# Patient Record
Sex: Female | Born: 1990 | Race: White | Hispanic: No | Marital: Single | Smoking: Never smoker
Health system: Southern US, Community
[De-identification: ages and names within clinical notes are randomized; demographics above are authoritative.]

## PROBLEM LIST (undated history)

## (undated) DIAGNOSIS — K589 Irritable bowel syndrome without diarrhea: Secondary | ICD-10-CM

---

## 2010-10-10 ENCOUNTER — Emergency Department (INDEPENDENT_AMBULATORY_CARE_PROVIDER_SITE_OTHER): Payer: Self-pay

## 2010-10-10 ENCOUNTER — Encounter: Payer: Self-pay | Admitting: *Deleted

## 2010-10-10 ENCOUNTER — Emergency Department (HOSPITAL_BASED_OUTPATIENT_CLINIC_OR_DEPARTMENT_OTHER)
Admission: EM | Admit: 2010-10-10 | Discharge: 2010-10-10 | Disposition: A | Discharge status: Home / Self Care | Payer: Self-pay | Attending: Emergency Medicine | Admitting: Emergency Medicine

## 2010-10-10 ENCOUNTER — Emergency Department (HOSPITAL_BASED_OUTPATIENT_CLINIC_OR_DEPARTMENT_OTHER): Payer: Self-pay

## 2010-10-10 DIAGNOSIS — R109 Unspecified abdominal pain: Secondary | ICD-10-CM

## 2010-10-10 DIAGNOSIS — R112 Nausea with vomiting, unspecified: Secondary | ICD-10-CM | POA: Insufficient documentation

## 2010-10-10 DIAGNOSIS — R1084 Generalized abdominal pain: Secondary | ICD-10-CM | POA: Insufficient documentation

## 2010-10-10 DIAGNOSIS — R197 Diarrhea, unspecified: Secondary | ICD-10-CM

## 2010-10-10 DIAGNOSIS — R110 Nausea: Secondary | ICD-10-CM

## 2010-10-10 HISTORY — DX: Irritable bowel syndrome, unspecified: K58.9

## 2010-10-10 LAB — COMPREHENSIVE METABOLIC PANEL
AST: 80 U/L — ABNORMAL HIGH (ref 0–37)
BUN: 11 mg/dL (ref 6–23)
CO2: 26 mEq/L (ref 19–32)
Calcium: 9.2 mg/dL (ref 8.4–10.5)
Creatinine, Ser: 0.6 mg/dL (ref 0.50–1.10)
GFR calc Af Amer: >60 mL/min (ref >60)
GFR calc non Af Amer: >60 mL/min (ref >60)
Total Bilirubin: 0.5 mg/dL (ref 0.3–1.2)

## 2010-10-10 LAB — CBC
HCT: 38.3 % (ref 36.0–46.0)
MCH: 28.9 pg (ref 26.0–34.0)
MCV: 83.3 fL (ref 78.0–100.0)
Platelets: 188 10*3/uL (ref 150–400)
RBC: 4.6 MIL/uL (ref 3.87–5.11)

## 2010-10-10 LAB — URINALYSIS, ROUTINE W REFLEX MICROSCOPIC
Bilirubin Urine: NEGATIVE
Glucose, UA: NEGATIVE mg/dL
Hgb urine dipstick: NEGATIVE
Protein, ur: NEGATIVE mg/dL
Specific Gravity, Urine: 1.016 (ref 1.005–1.030)
Urobilinogen, UA: 0.2 mg/dL (ref 0.0–1.0)

## 2010-10-10 LAB — LIPASE, BLOOD: Lipase: 15 U/L (ref 11–59)

## 2010-10-10 MED ORDER — DICYCLOMINE HCL 20 MG PO TABS: 20.0000 mg | ORAL_TABLET | Freq: Two times a day (BID) | ORAL | Status: AC

## 2010-10-10 NOTE — ED Provider Notes (Signed)
History     Chief Complaint  Patient presents with  . Abdominal Cramping   Patient is a 20 y.o. female presenting with cramps. The history is provided by the patient.  Abdominal Cramping The primary symptoms of the illness include abdominal pain, nausea, vomiting and diarrhea. The current episode started more than 2 days ago. The onset of the illness was gradual. The problem has been gradually worsening.  The patient states that she believes she is currently not pregnant. Additional symptoms associated with the illness include back pain.  Pt is currently on periood,  Pt reports she has had IBS.  She is not currently on any medications.  Past Medical History  Diagnosis Date  . IBS (irritable bowel syndrome)     History reviewed. No pertinent past surgical history.  Family History  Problem Relation Age of Onset  . Diabetes Mother     History  Substance Use Topics  . Smoking status: Never Smoker   . Smokeless tobacco: Not on file  . Alcohol Use: No    OB History    Grav Para Term Preterm Abortions TAB SAB Ect Mult Living                  Review of Systems  Gastrointestinal: Positive for nausea, vomiting, abdominal pain and diarrhea.  Musculoskeletal: Positive for back pain.  All other systems reviewed and are negative.    Physical Exam  BP 122/78  Pulse 66  Temp(Src) 98 F (36.7 C) (Oral)  Resp 16  Wt 204 lb (92.534 kg)  SpO2 100%  LMP 10/09/2010  Physical Exam  Constitutional: She is oriented to person, place, and time. She appears well-developed.  HENT:  Head: Normocephalic and atraumatic.  Eyes: Conjunctivae are normal. Pupils are equal, round, and reactive to light.  Neck: Normal range of motion.  Cardiovascular: Normal rate.   Pulmonary/Chest: Effort normal.  Abdominal: She exhibits no distension. There is no tenderness. There is no guarding.  Musculoskeletal: Normal range of motion.  Neurological: She is alert and oriented to person, place, and  time.  Skin: Skin is warm.  Psychiatric: She has a normal mood and affect.    ED Course  Procedures  MDM Ultrasound no gallbladder disease.  Pt has had IBS in the past.  I will treat with bentyl and refer to primary care for on going evaluation.        Langston Masker, [place] 10/10/10 1843

## 2010-10-10 NOTE — ED Notes (Signed)
Pt c/o abd cramping x 1 week with nausea Hx IBS and constipaion

## 2010-10-10 NOTE — ED Notes (Signed)
MD at bedside. 

## 2010-10-14 NOTE — ED Provider Notes (Signed)
Medical screening examination/treatment/procedure(s) were performed by non-physician practitioner and as supervising physician I was immediately available for consultation/collaboration.   Charles B. Bernette Mayers, MD 10/14/10 [phone]

## 2012-02-09 IMAGING — US US ABDOMEN COMPLETE
1 series · 13 of 25 positions shown · non-contrast
Comparison: None.

CLINICAL DATA: Abdominal pain and cramping.  Nausea and diarrhea
for 1 week.

COMPLETE ABDOMINAL ULTRASOUND

[Series 1: us abdomen complete · 0.30mm/px · 13 of 74 slices shown]
[im 1/74]
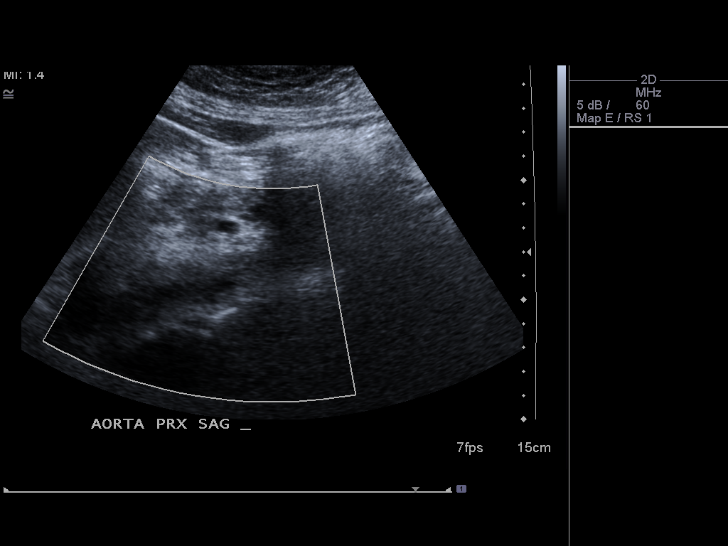
[im 7/74]
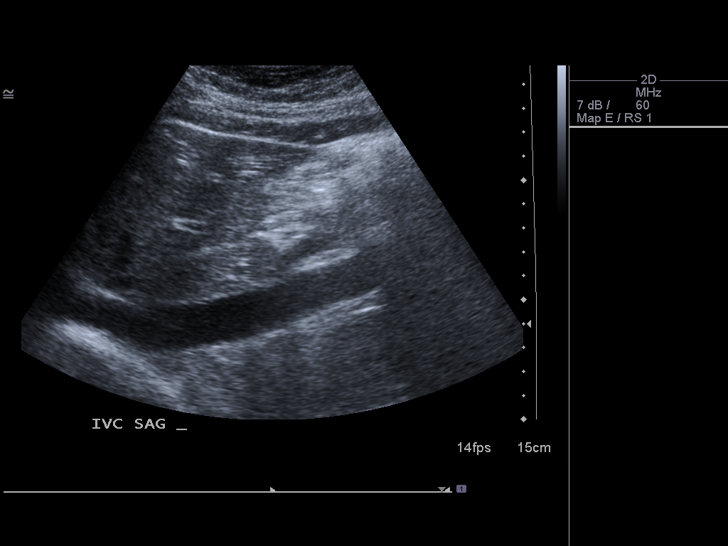
[im 13/74]
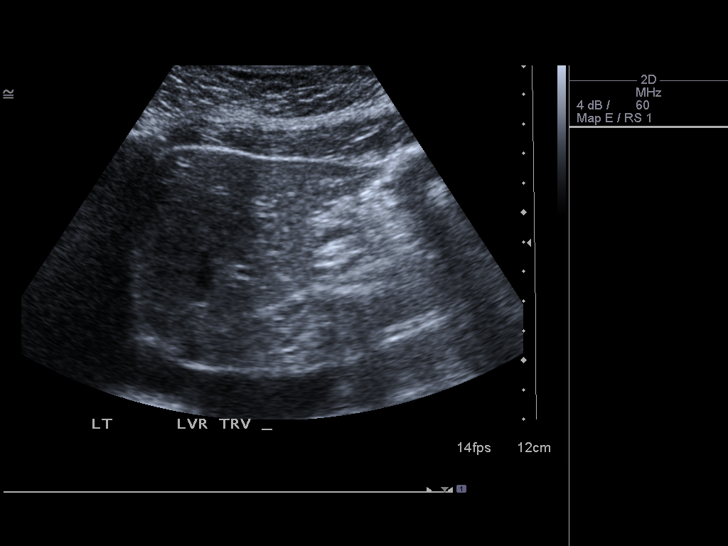
[im 19/74]
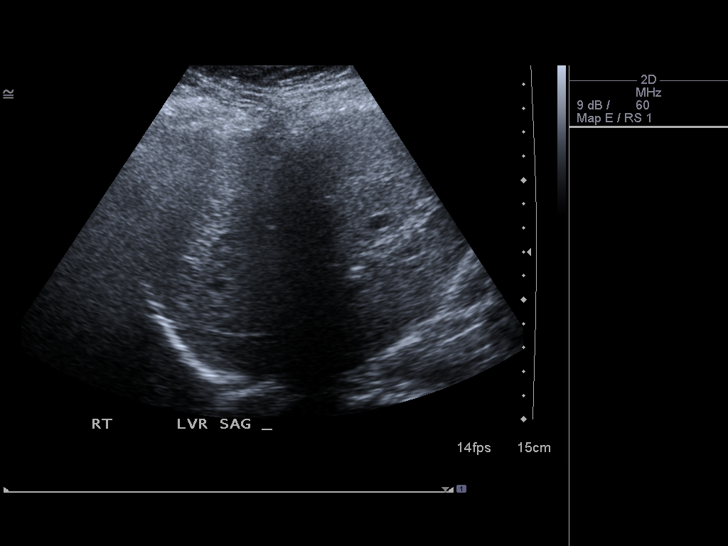
[im 25/74]
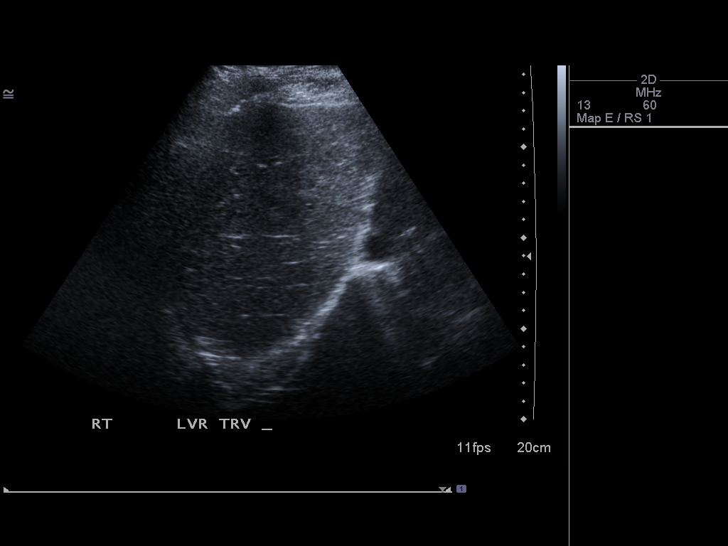
[im 31/74]
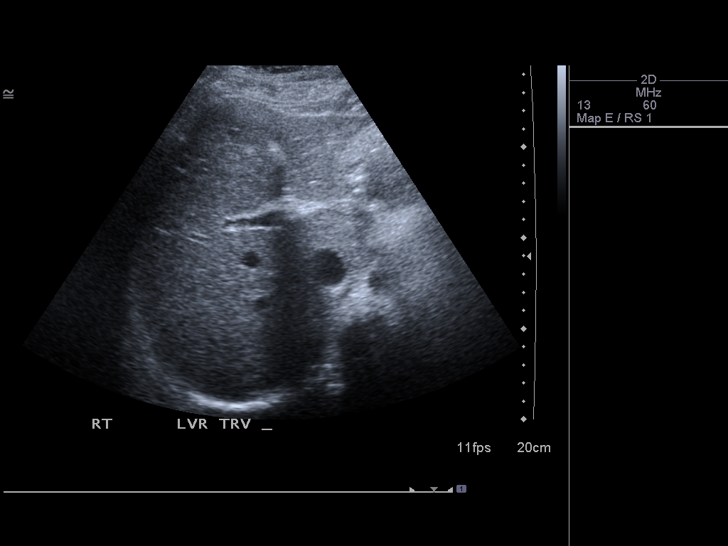
[im 37/74]
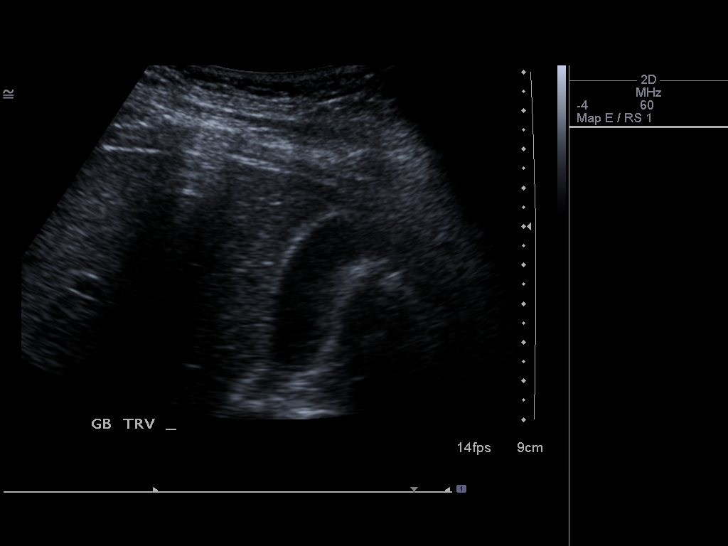
[im 43/74]
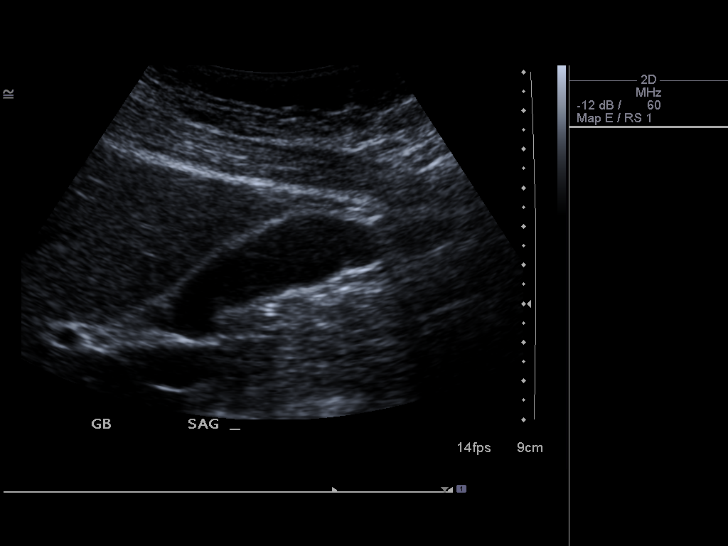
[im 49/74]
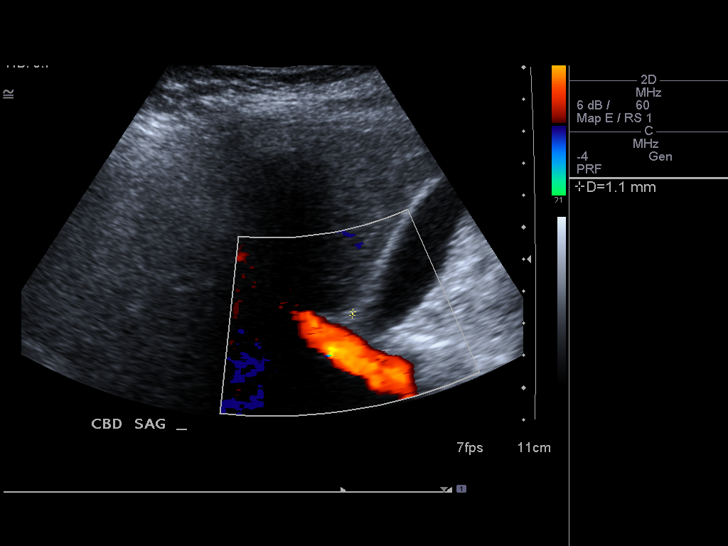
[im 55/74]
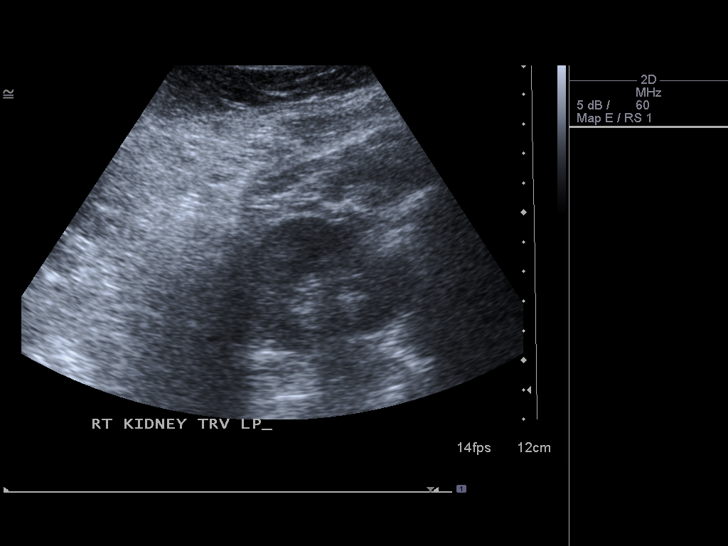
[im 61/74]
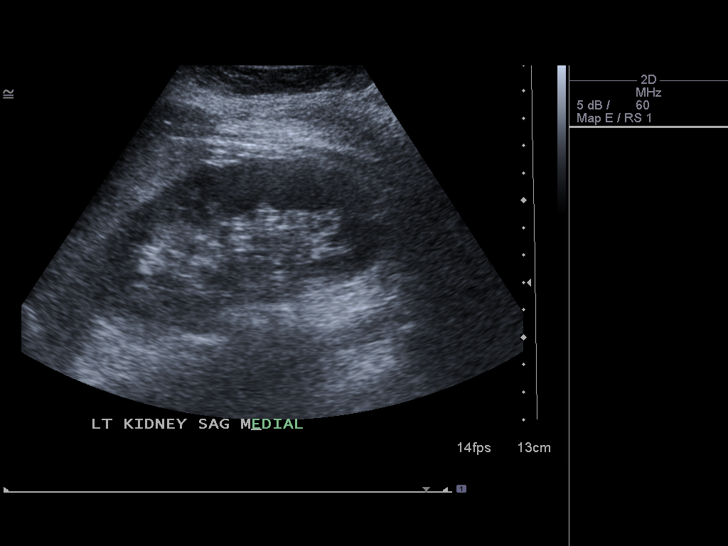
[im 67/74]
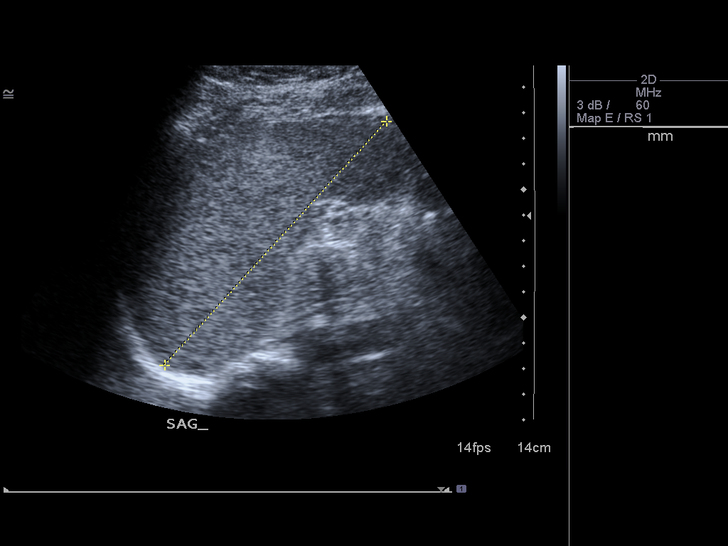
[im 74/74]
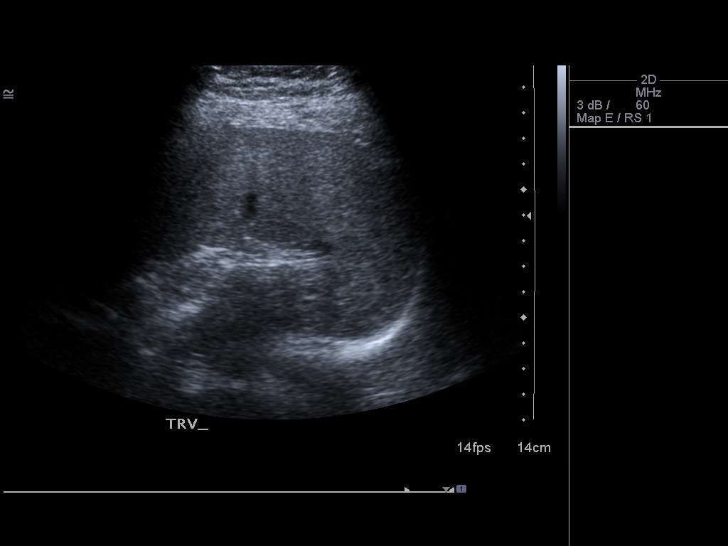

[13 of 25 positions shown; findings below may reference images not displayed]

FINDINGS: Gallbladder:  No shadowing gallstones or echogenic sludge.  No
gallbladder wall thickening or pericholecystic fluid.  No
sonographic Murphy's sign according to the ultrasound technologist.
Wall thickness is 1.6 mm.

Common bile duct:  Normal in caliber. No biliary ductal
dilation.The maximal diameter is 1.6 mm.

Liver:  No focal lesion identified.  Within normal limits in
parenchymal echogenicity.

IVC:  Appears normal.

Pancreas:  Although the pancreas is difficult to visualize in its
entirety, no focal pancreatic abnormality is identified.

Spleen:  Normal size and echotexture without focal parenchymal
abnormality.  The maximal length is 10.5 cm.

Right Kidney:  No hydronephrosis.  Well-preserved cortex.  Normal
size and parenchymal echotexture without focal abnormalities.  The
maximal length is 11.4 cm.

Left Kidney:  No hydronephrosis.  Well-preserved cortex.  Normal
size and parenchymal echotexture without focal abnormalities.   The
maximal length is 11.4 cm.

Abdominal aorta:  No aneurysm identified.
IMPRESSION: Negative abdominal ultrasound.

## 2017-01-21 ENCOUNTER — Emergency Department (HOSPITAL_BASED_OUTPATIENT_CLINIC_OR_DEPARTMENT_OTHER)
Admission: EM | Admit: 2017-01-21 | Discharge: 2017-01-21 | Disposition: A | Discharge status: Home / Self Care | Payer: Medicaid Other | Attending: Emergency Medicine | Admitting: Emergency Medicine

## 2017-01-21 ENCOUNTER — Encounter (HOSPITAL_BASED_OUTPATIENT_CLINIC_OR_DEPARTMENT_OTHER): Payer: Self-pay

## 2017-01-21 DIAGNOSIS — R21 Rash and other nonspecific skin eruption: Secondary | ICD-10-CM | POA: Diagnosis present

## 2017-01-21 DIAGNOSIS — B084 Enteroviral vesicular stomatitis with exanthem: Secondary | ICD-10-CM | POA: Diagnosis not present

## 2017-01-21 MED ORDER — TRAMADOL HCL 50 MG PO TABS: 50.0000 mg | ORAL_TABLET | Freq: Four times a day (QID) | ORAL | 0 refills | Status: AC | PRN

## 2017-01-21 MED ORDER — TRAMADOL HCL 50 MG PO TABS
50.0000 mg | ORAL_TABLET | Freq: Once | ORAL | Status: AC
  Administered 2017-01-21: 50 mg via ORAL
  Filled 2017-01-21: qty 1

## 2017-01-21 NOTE — Discharge Instructions (Signed)
You may alternate Tylenol 1000 mg every 6 hours as needed for pain and Ibuprofen 800 mg every 8 hours as needed for pain.  Please take Ibuprofen with food.   You may take Benadryl 25-50 mg every 8 hours as needed for itching.   Please did not breast-feed when taking tramadol.  You should wait at least 48-72 hours after your last medication to provide your baby this milk.  I recommend that you continue to pump so that you continue to maintain milk supply.

## 2017-01-21 NOTE — ED Triage Notes (Signed)
Pt c/o her babies having hand, foot and mouth; she got it a week ago, theres are getting better and hers is worse

## 2017-01-21 NOTE — ED Provider Notes (Signed)
TIME SEEN: 1:36 AM  CHIEF COMPLAINT: Rash  HPI: Patient is a 26 year old female with no significant past medical history who presents to the emergency department with a rash that has been present for the past week and is getting worse.  Reports both of her children have hand-foot-and-mouth.  She states she developed symptoms a week ago but feels like they are beginning to spread.  She states that her friend's mother is a Engineer, civil (consulting) and was concerned that this could be shingles.  No fevers, cough, vomiting or diarrhea.  She has been able to resolve.  She states she is taking Benadryl for itching and this is improving her symptoms but she is having a lot of pain especially in her hands and feet.  These areas are very painful even to light touch.  She is not able to breast-feed at this time as she has a lesions around her nipples.  No other new soaps, lotions, detergents, medications.  No tick bites.  ROS: See HPI Constitutional: no fever  Eyes: no drainage  ENT: no runny nose   Cardiovascular:  no chest pain  Resp: no SOB  GI: no vomiting GU: no dysuria Integumentary:  rash  Allergy: no hives  Musculoskeletal: no leg swelling  Neurological: no slurred speech ROS otherwise negative  PAST MEDICAL HISTORY/PAST SURGICAL HISTORY:  Past Medical History:  Diagnosis Date  . IBS (irritable bowel syndrome)     MEDICATIONS:  Prior to Admission medications   Medication Sig Start Date End Date Taking? Authorizing Provider  dicyclomine (BENTYL) 20 MG tablet Take 1 tablet (20 mg total) by mouth 2 (two) times daily. 10/10/10 10/10/11  Elson Areas, PA-C  naphazoline (CLEAR EYES) 0.012 % ophthalmic solution Place 2 drops into both eyes as needed. For allergies     [provider]    ALLERGIES:  No Known Allergies  SOCIAL HISTORY:  Social History  Substance Use Topics  . Smoking status: Never Smoker  . Smokeless tobacco: Not on file  . Alcohol use No    FAMILY HISTORY: Family History   Problem Relation Age of Onset  . Diabetes Mother     EXAM: BP (!) 170/91 (BP Location: Right Arm)   Pulse (!) 57   Temp 98.6 F (37 C) (Oral)   Resp 18   Ht 5\' 7"  (1.702 m)   Wt 108 kg (238 lb)   SpO2 99%   BMI 37.28 kg/m  CONSTITUTIONAL: Alert and oriented and responds appropriately to questions. Well-appearing; well-nourished HEAD: Normocephalic EYES: Conjunctivae clear, pupils appear equal, EOMI ENT: normal nose; moist mucous membranes; No pharyngeal erythema or petechiae, no tonsillar hypertrophy or exudate, no uvular deviation, no unilateral swelling, no trismus or drooling, no muffled voice, normal phonation, no stridor, no dental caries present, no drainable dental abscess noted, no Ludwig's angina, tongue sits flat in the bottom of the mouth, no angioedema, no facial erythema or warmth, no facial swelling; no pain with movement of the neck. NECK: Supple, no meningismus, no nuchal rigidity, no LAD  CARD: RRR; S1 and S2 appreciated; no murmurs, no clicks, no rubs, no gallops RESP: Normal chest excursion without splinting or tachypnea; breath sounds clear and equal bilaterally; no wheezes, no rhonchi, no rales, no hypoxia or respiratory distress, speaking full sentences ABD/GI: Normal bowel sounds; non-distended; soft, non-tender, no rebound, no guarding, no peritoneal signs, no hepatosplenomegaly BACK:  The back appears normal and is non-tender to palpation, there is no CVA tenderness EXT: Normal ROM in all joints;  non-tender to palpation; no edema; normal capillary refill; no cyanosis, no calf tenderness or swelling    SKIN: Normal color for age and race; warm; erythematous papular rash mostly around the feet and hands consistent with hand-foot-and-mouth, no ulcerative lesions, no blisters or desquamation, no petechia or purpura, no hives NEURO: Moves all extremities equally PSYCH: The patient's mood and manner are appropriate. Grooming and personal hygiene are  appropriate.  MEDICAL DECISION MAKING: Patient here with hand-foot-and-mouth.  She has scattered lesions all over her body but mostly involving the hands and feet.  No signs of superimposed bacterial infection.  No sign of life-threatening illness.  She appears well hydrated here and is afebrile and nontoxic.  Discussed with her that this is caused by a viral illness and that antibiotics would not be helpful.  I have discussed that this is treated with supportive care and will take time to improve.  It does not look like disseminated shingles.  It does not follow a dermatome.  Does not look like chickenpox.  It does not look like RMSF, meningitis, urticaria.  Does not look life-threatening today.  I recommended she continue Benadryl.  Have offered her a prescription for Vistaril which she declines.  We will provide her with tramadol.  She understands she cannot breast-feed when taking this medication.  She has an outpatient primary care doctor for follow-up.  Discussed return precautions.  At this time, I do not feel there is any life-threatening condition present. I have reviewed and discussed all results (EKG, imaging, lab, urine as appropriate) and exam findings with patient/family. I have reviewed nursing notes and appropriate previous records.  I feel the patient is safe to be discharged home without further emergent workup and can continue workup as an outpatient as needed. Discussed usual and customary return precautions. Patient/family verbalize understanding and are comfortable with this plan.  Outpatient follow-up has been provided if needed. All questions have been answered.      Tela Kotecki, Layla MawKristen N, DO 01/21/17 [phone]
